# Patient Record
Sex: Male | Born: 1945 | Race: Black or African American | Hispanic: No | Marital: Married | Smoking: Never smoker
Health system: Southern US, Community
[De-identification: ages and names within clinical notes are randomized; demographics above are authoritative.]

## PROBLEM LIST (undated history)

## (undated) DIAGNOSIS — E119 Type 2 diabetes mellitus without complications: Secondary | ICD-10-CM

## (undated) DIAGNOSIS — T7840XA Allergy, unspecified, initial encounter: Secondary | ICD-10-CM

## (undated) DIAGNOSIS — F4310 Post-traumatic stress disorder, unspecified: Secondary | ICD-10-CM

## (undated) DIAGNOSIS — E785 Hyperlipidemia, unspecified: Secondary | ICD-10-CM

## (undated) DIAGNOSIS — M109 Gout, unspecified: Secondary | ICD-10-CM

## (undated) DIAGNOSIS — G2581 Restless legs syndrome: Secondary | ICD-10-CM

## (undated) DIAGNOSIS — N529 Male erectile dysfunction, unspecified: Secondary | ICD-10-CM

## (undated) DIAGNOSIS — I10 Essential (primary) hypertension: Secondary | ICD-10-CM

## (undated) DIAGNOSIS — H409 Unspecified glaucoma: Secondary | ICD-10-CM

## (undated) HISTORY — DX: Type 2 diabetes mellitus without complications: E11.9

## (undated) HISTORY — DX: Gout, unspecified: M10.9

## (undated) HISTORY — DX: Male erectile dysfunction, unspecified: N52.9

## (undated) HISTORY — DX: Post-traumatic stress disorder, unspecified: F43.10

## (undated) HISTORY — DX: Hyperlipidemia, unspecified: E78.5

## (undated) HISTORY — DX: Restless legs syndrome: G25.81

## (undated) HISTORY — DX: Unspecified glaucoma: H40.9

## (undated) HISTORY — DX: Allergy, unspecified, initial encounter: T78.40XA

## (undated) HISTORY — DX: Essential (primary) hypertension: I10

---

## 1998-03-06 HISTORY — PX: OTHER SURGICAL HISTORY: SHX169

## 2005-10-06 ENCOUNTER — Emergency Department (HOSPITAL_COMMUNITY): Admission: EM | Admit: 2005-10-06 | Discharge: 2005-10-06 | Payer: Self-pay | Admitting: Emergency Medicine
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
## 2011-06-05 HISTORY — PX: UMBILICAL HERNIA REPAIR: SHX196

## 2012-05-22 ENCOUNTER — Telehealth: Payer: Self-pay | Admitting: *Deleted

## 2012-05-24 NOTE — Telephone Encounter (Signed)
I called [facility] and spoke with the pharmacist who is requesting Requip 1 mg 1 po qhs #90.-- Pt ID # is [id number] The patient was last seen 03/12/12 for medication refills. Please advise if refill is appropriate.    Thank You     KP//CMA

## 2012-05-27 ENCOUNTER — Other Ambulatory Visit: Payer: Self-pay | Admitting: Family Medicine

## 2012-05-27 MED ORDER — ROPINIROLE HCL 1 MG PO TABS: 1.0000 mg | ORAL_TABLET | Freq: Every day | ORAL | Status: DC

## 2012-07-09 ENCOUNTER — Ambulatory Visit (INDEPENDENT_AMBULATORY_CARE_PROVIDER_SITE_OTHER): Payer: Medicare Other | Admitting: Family Medicine

## 2012-07-09 VITALS — BP 134/66 | HR 75 | Resp 14 | Ht 68.5 in | Wt 240.0 lb

## 2012-07-09 DIAGNOSIS — R51 Headache: Secondary | ICD-10-CM

## 2012-07-09 DIAGNOSIS — G2581 Restless legs syndrome: Secondary | ICD-10-CM

## 2012-07-09 DIAGNOSIS — E669 Obesity, unspecified: Secondary | ICD-10-CM

## 2012-07-09 MED ORDER — PHENTERMINE HCL 37.5 MG PO TABS: ORAL_TABLET | ORAL | Status: DC

## 2012-07-09 MED ORDER — PHENTERMINE-TOPIRAMATE ER 7.5-46 MG PO CP24: 1.0000 | ORAL_CAPSULE | Freq: Every day | ORAL | Status: DC

## 2012-07-09 MED ORDER — TOPIRAMATE ER 50 MG PO CAP24: 1.0000 | ORAL_CAPSULE | Freq: Every day | ORAL | Status: DC

## 2012-07-09 MED ORDER — PHENTERMINE-TOPIRAMATE ER 11.25-69 MG PO CP24: 1.0000 | ORAL_CAPSULE | ORAL | Status: DC

## 2012-07-09 NOTE — Patient Instructions (Addendum)
1)  Weight Loss - You have done great with your weight loss 18 lbs so keep up the good work.  Since the cost of Qsymia is so high we may need to be creative.  See if this co-pay card helps you at all.  If it does and you want to stay on what you have now then you can.  IF you want to try and save money one option would be to take a higher dosage every other day.  Another option is to make up our own Qsymia by combining a long acting Topamax and add some phentermine.    2)  Medication Refills - Check with [facility] to see what if any refills you need.   1800 Calorie Diet for Diabetes Meal Planning The 1800 calorie diet is designed for eating up to 1800 calories each day. Following this diet and making healthy meal choices can help improve overall health. This diet controls blood sugar (glucose) levels and can also help lower blood pressure and cholesterol. SERVING SIZES Measuring foods and serving sizes helps to make sure you are getting the right amount of food. The list below tells how big or small some common serving sizes are:  1 oz.........4 stacked dice.  3 oz........Marland KitchenDeck of cards.  1 tsp.......Marland KitchenTip of little finger.  1 tbs......Marland KitchenMarland KitchenThumb.  2 tbs.......Marland KitchenGolf ball.   cup......Marland KitchenHalf of a fist.  1 cup.......Marland KitchenA fist. GUIDELINES FOR CHOOSING FOODS The goal of this diet is to eat a variety of foods and limit calories to 1800 each day. This can be done by choosing foods that are low in calories and fat. The diet also suggests eating small amounts of food frequently. Doing this helps control your blood glucose levels so they do not get too high or too low. Each meal or snack may include a protein food source to help you feel more satisfied and to stabilize your blood glucose. Try to eat about the same amount of food around the same time each day. This includes weekend days, travel days, and days off work. Space your meals about 4 to 5 hours apart and add a snack between them if you wish.   For example, a daily food plan could include breakfast, a morning snack, lunch, dinner, and an evening snack. Healthy meals and snacks include whole grains, vegetables, fruits, lean meats, poultry, fish, and dairy products. As you plan your meals, select a variety of foods. Choose from the bread and starch, vegetable, fruit, dairy, and meat/protein groups. Examples of foods from each group and their suggested serving sizes are listed below. Use measuring cups and spoons to become familiar with what a healthy portion looks like. Bread and Starch Each serving equals 15 grams of carbohydrates.  1 slice bread.   bagel.   cup cold cereal (unsweetened).   cup hot cereal or mashed potatoes.  1 small potato (size of a computer mouse).   cup cooked pasta or rice.   English muffin.  1 cup broth-based soup.  3 cups of popcorn.  4 to 6 whole-wheat crackers.   cup cooked beans, peas, or corn. Vegetable Each serving equals 5 grams of carbohydrates.   cup cooked vegetables.  1 cup raw vegetables.   cup tomato or vegetable juice. Fruit Each serving equals 15 grams of carbohydrates.  1 small apple or orange.  1 cup watermelon or strawberries.   cup applesauce (no sugar added).  2 tbs raisins.   banana.   cup canned fruit, packed in water,  its own juice, or sweetened with a sugar substitute.   cup unsweetened fruit juice. Dairy Each serving equals 12 to 15 grams of carbohydrates.  1 cup fat-free milk.  6 oz artificially sweetened yogurt or plain yogurt.  1 cup low-fat buttermilk.  1 cup soy milk.  1 cup almond milk. Meat/Protein  1 large egg.  2 to 3 oz meat, poultry, or fish.   cup low-fat cottage cheese.  1 tbs peanut butter.  1 oz low-fat cheese.   cup tuna in water.   cup tofu. Fat  1 tsp oil.  1 tsp trans-fat-free margarine.  1 tsp butter.  1 tsp mayonnaise.  2 tbs avocado.  1 tbs salad dressing.  1 tbs cream cheese.  2  tbs sour cream. SAMPLE 1800 CALORIE DIET PLAN Breakfast   cup unsweetened cereal (1 carb serving).  1 cup fat-free milk (1 carb serving).  1 slice whole-wheat toast (1 carb serving).   small banana (1 carb serving).  1 scrambled egg.  1 tsp trans-fat-free margarine. Lunch  Tuna sandwich.  2 slices whole-wheat bread (2 carb servings).   cup canned tuna in water, drained.  1 tbs reduced fat mayonnaise.  1 stalk celery, chopped.  2 slices tomato.  1 lettuce leaf.  1 cup carrot sticks.  24 to 30 seedless grapes (2 carb servings).  6 oz light yogurt (1 carb serving). Afternoon Snack  3 graham cracker squares (1 carb serving).  Fat-free milk, 1 cup (1 carb serving).  1 tbs peanut butter. Dinner  3 oz salmon, broiled with 1 tsp oil.  1 cup mashed potatoes (2 carb servings) with 1 tsp trans-fat-free margarine.  1 cup fresh or frozen green beans.  1 cup steamed asparagus.  1 cup fat-free milk (1 carb serving). Evening Snack  3 cups air-popped popcorn (1 carb serving).  2 tbs parmesan cheese sprinkled on top. MEAL PLAN Use this worksheet to help you make a daily meal plan based on the 1800 calorie diet suggestions. If you are using this plan to help you control your blood glucose, you may interchange carbohydrate-containing foods (dairy, starches, and fruits). Select a variety of fresh foods of varying colors and flavors. The total amount of carbohydrate in your meals or snacks is more important than making sure you include all of the food groups every time you eat. Choose from the following foods to build your day's meals:  8 Starches.  4 Vegetables.  3 Fruits.  2 Dairy.  6 to 7 oz Meat/Protein.  Up to 4 Fats. Your dietician can use this worksheet to help you decide how many servings and which types of foods are right for you. BREAKFAST Food Group and Servings / Food Choice Starch ________________________________________________________ Dairy  _________________________________________________________ Fruit _________________________________________________________ Meat/Protein __________________________________________________ Fat ___________________________________________________________ LUNCH Food Group and Servings / Food Choice Starch ________________________________________________________ Meat/Protein __________________________________________________ Vegetable _____________________________________________________ Fruit _________________________________________________________ Dairy _________________________________________________________ Fat ___________________________________________________________ Aura Fey Food Group and Servings / Food Choice Starch ________________________________________________________ Meat/Protein __________________________________________________ Fruit __________________________________________________________ Dairy _________________________________________________________ Laural Golden Food Group and Servings / Food Choice Starch _________________________________________________________ Meat/Protein ___________________________________________________ Dairy __________________________________________________________ Vegetable ______________________________________________________ Fruit ___________________________________________________________ Fat ____________________________________________________________ Lollie Sails Food Group and Servings / Food Choice Fruit __________________________________________________________ Meat/Protein ___________________________________________________ Dairy __________________________________________________________ Starch _________________________________________________________ DAILY TOTALS Starch ____________________________ Vegetable _________________________ Fruit _____________________________ Dairy  _____________________________ Meat/Protein______________________ Fat _______________________________ Document Released: 09/12/2004 Document Revised: 05/15/2011 Document Reviewed: 01/06/2011 Exit[employer] Patient Information 2013 [place], [place].
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
## 2012-07-18 ENCOUNTER — Other Ambulatory Visit: Payer: Self-pay | Admitting: Family Medicine

## 2012-07-20 ENCOUNTER — Encounter: Payer: Self-pay | Admitting: Family Medicine

## 2012-07-21 ENCOUNTER — Encounter: Payer: Self-pay | Admitting: Family Medicine

## 2012-07-21 DIAGNOSIS — E669 Obesity, unspecified: Secondary | ICD-10-CM | POA: Insufficient documentation

## 2012-07-21 DIAGNOSIS — G2581 Restless legs syndrome: Secondary | ICD-10-CM | POA: Insufficient documentation

## 2012-07-21 DIAGNOSIS — R51 Headache: Secondary | ICD-10-CM | POA: Insufficient documentation

## 2012-07-21 DIAGNOSIS — R519 Headache, unspecified: Secondary | ICD-10-CM | POA: Insufficient documentation

## 2012-07-21 NOTE — Assessment & Plan Note (Signed)
Refilled his Requip.

## 2012-07-21 NOTE — Assessment & Plan Note (Signed)
He has done great with his weight loss.  He is going to continue on Qsymia vs Phentermine.

## 2012-07-21 NOTE — Assessment & Plan Note (Signed)
He thinks that he has had less headaches on the Qsymia so he may try the Topiramate ER.

## 2012-07-31 ENCOUNTER — Other Ambulatory Visit: Payer: Self-pay | Admitting: Family Medicine

## 2012-08-22 ENCOUNTER — Other Ambulatory Visit: Payer: Self-pay | Admitting: Family Medicine

## 2012-09-02 ENCOUNTER — Other Ambulatory Visit: Payer: Self-pay

## 2012-09-02 NOTE — Telephone Encounter (Addendum)
I called Mr. Fillingim to see which pharmacy to call her 30 day supply of Exforge in to since [facility] only will fill 90 day supply or more.  I attempted to call the patient again on 09/03/2012 @ 10:15. I couldn't leave a message due to the mailbox being full. LB

## 2012-09-03 ENCOUNTER — Other Ambulatory Visit: Payer: Self-pay | Admitting: Family Medicine

## 2012-09-03 NOTE — Telephone Encounter (Signed)
We have tried to contact this pt for an appointment for his labs and then a week later to see Dr Herma Carson (no answer and voice mail is full).  I'm denying his refills until he contact us. PG

## 2012-09-05 ENCOUNTER — Other Ambulatory Visit: Payer: Medicare Other | Admitting: Family Medicine

## 2012-09-05 ENCOUNTER — Telehealth: Payer: Self-pay | Admitting: *Deleted

## 2012-09-05 LAB — COMPREHENSIVE METABOLIC PANEL
ALT: 17 U/L (ref 0–53)
AST: 20 U/L (ref 0–37)
Albumin: 4 g/dL (ref 3.5–5.2)
Alkaline Phosphatase: 42 U/L (ref 39–117)
BUN: 10 mg/dL (ref 6–23)
CO2: 25 mEq/L (ref 19–32)
Calcium: 9 mg/dL (ref 8.4–10.5)
Chloride: 107 mEq/L (ref 96–112)
Creat: 0.9 mg/dL (ref 0.50–1.35)
Glucose, Bld: 95 mg/dL (ref 70–99)
Potassium: 3.8 mEq/L (ref 3.5–5.3)
Sodium: 137 mEq/L (ref 135–145)
Total Bilirubin: 0.6 mg/dL (ref 0.3–1.2)
Total Protein: 6.5 g/dL (ref 6.0–8.3)

## 2012-09-05 LAB — CBC WITH DIFFERENTIAL/PLATELET
Basophils Absolute: 0 10*3/uL (ref 0.0–0.1)
Basophils Relative: 0 % (ref 0–1)
Eosinophils Absolute: 0.1 10*3/uL (ref 0.0–0.7)
Eosinophils Relative: 2 % (ref 0–5)
HCT: 40.5 % (ref 39.0–52.0)
Hemoglobin: 14 g/dL (ref 13.0–17.0)
Lymphocytes Relative: 31 % (ref 12–46)
Lymphs Abs: 1.8 10*3/uL (ref 0.7–4.0)
MCH: 29.7 pg (ref 26.0–34.0)
MCHC: 34.6 g/dL (ref 30.0–36.0)
MCV: 86 fL (ref 78.0–100.0)
Monocytes Absolute: 0.6 10*3/uL (ref 0.1–1.0)
Monocytes Relative: 11 % (ref 3–12)
Neutro Abs: 3.2 10*3/uL (ref 1.7–7.7)
Neutrophils Relative %: 56 % (ref 43–77)
Platelets: 305 10*3/uL (ref 150–400)
RBC: 4.71 MIL/uL (ref 4.22–5.81)
RDW: 14.7 % (ref 11.5–15.5)
WBC: 5.8 10*3/uL (ref 4.0–10.5)

## 2012-09-05 LAB — LIPID PANEL
Cholesterol: 204 mg/dL — ABNORMAL HIGH (ref 0–200)
HDL: 41 mg/dL (ref >39)
LDL Cholesterol: 143 mg/dL — ABNORMAL HIGH (ref 0–99)
Total CHOL/HDL Ratio: 5 Ratio
Triglycerides: 100 mg/dL (ref <150)
VLDL: 20 mg/dL (ref 0–40)

## 2012-09-05 LAB — TSH: TSH: 1.709 u[IU]/mL (ref 0.350–4.500)

## 2012-09-05 LAB — HEMOGLOBIN A1C
Hgb A1c MFr Bld: 5.7 % — ABNORMAL HIGH (ref <5.7)
Mean Plasma Glucose: 117 mg/dL — ABNORMAL HIGH (ref <117)

## 2012-09-05 MED ORDER — AMLODIPINE-VALSARTAN-HCTZ 10-320-25 MG PO TABS: 1.0000 mg | ORAL_TABLET | Freq: Every day | ORAL | Status: DC

## 2012-09-05 NOTE — Telephone Encounter (Signed)
Please send in a 30 day supply on Exforge to [facility] on [address] in [place].  He went for labs today and will f/u on 09/10/12.

## 2012-09-10 ENCOUNTER — Ambulatory Visit (INDEPENDENT_AMBULATORY_CARE_PROVIDER_SITE_OTHER): Payer: Medicare Other | Admitting: Family Medicine

## 2012-09-10 ENCOUNTER — Encounter: Payer: Self-pay | Admitting: Family Medicine

## 2012-09-10 VITALS — BP 181/76 | HR 67 | Wt 243.0 lb

## 2012-09-10 DIAGNOSIS — I10 Essential (primary) hypertension: Secondary | ICD-10-CM

## 2012-09-10 DIAGNOSIS — IMO0001 Reserved for inherently not codable concepts without codable children: Secondary | ICD-10-CM

## 2012-09-10 DIAGNOSIS — E785 Hyperlipidemia, unspecified: Secondary | ICD-10-CM

## 2012-09-10 DIAGNOSIS — E119 Type 2 diabetes mellitus without complications: Secondary | ICD-10-CM

## 2012-09-10 MED ORDER — CANAGLIFLOZIN 300 MG PO TABS: 1.0000 | ORAL_TABLET | Freq: Every day | ORAL | Status: DC

## 2012-09-10 MED ORDER — AMLODIPINE-VALSARTAN-HCTZ 10-320-25 MG PO TABS: 1.0000 | ORAL_TABLET | Freq: Every day | ORAL | Status: DC

## 2012-09-10 MED ORDER — CARVEDILOL 25 MG PO TABS: 25.0000 mg | ORAL_TABLET | Freq: Two times a day (BID) | ORAL | Status: DC

## 2012-09-10 MED ORDER — EZETIMIBE 10 MG PO TABS: 10.0000 mg | ORAL_TABLET | Freq: Every day | ORAL | Status: DC

## 2012-09-10 NOTE — Progress Notes (Signed)
  Subjective:    Patient ID: Johnathan Long, male    DOB: 1945/11/04, 67 y.o.   MRN: [id number]  HPI  Johnathan Long is here today to follow up on his lab results, discuss the following conditions and get refills for several of his medications.    1)  Hypertension: His BP is high today because he is out of Exforge.    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 He does not monitor his sugars at home.  3)  Hyperlipidemia: He is doing well on his Zetia.   4)  GERD: He continues to do well on the Nexium.   5)  Restless Leg: His symptoms are controlled on Requip.     Review of Systems  Constitutional: Negative.   HENT: Negative.   Eyes: Negative.   Respiratory: Negative.   Cardiovascular: Negative.   Gastrointestinal: Negative.   Endocrine: Negative.   Genitourinary: Negative.   Musculoskeletal: Negative.   Skin: Negative.   Allergic/Immunologic: Negative.   Neurological: Negative.   Hematological: Negative.   Psychiatric/Behavioral: Negative.     Past Medical History  Diagnosis Date  . Hyperlipidemia   . Diabetes mellitus without complication   . Hypertension   . Restless leg syndrome   . Gout   . PTSD (post-traumatic stress disorder)   . Glaucoma   . Erectile dysfunction   . Allergy     Family History  Problem Relation Age of Onset  . Heart disease Father   . Cancer Sister     Breast Cancer   . 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     Education:  Engineer, agricultural    Tobacco Use/Exposure:  None    Alcohol Use:  None   Drug Use:  None   Diet:  Regular   Exercise:  Walking    Hobbies: Fishing, Music, Reading                Objective:   Physical Exam  Constitutional: He appears well-nourished.  HENT:  Head: Normocephalic.   Nose: Nose normal.  Mouth/Throat: Oropharynx is clear and moist.  Eyes: Conjunctivae are normal. No scleral icterus.  Neck: Neck supple. No thyromegaly present.  Cardiovascular: Normal rate, regular rhythm and normal heart sounds.   Pulmonary/Chest: Effort normal and breath sounds normal.  Abdominal: Soft. He exhibits no mass. There is no tenderness.  Musculoskeletal: Normal range of motion.  Lymphadenopathy:    He has no cervical adenopathy.  Neurological: He is alert.  Skin: Skin is warm and dry. No rash noted.  Psychiatric: He has a normal mood and affect. His behavior is normal. Judgment and thought content normal.          Assessment & Plan:

## 2012-09-10 NOTE — Patient Instructions (Addendum)
1)  Blood Pressure - I sent in a refill for the Exforge HCT and the carvedilol.  You should have plenty of the Tribenzor which is like Exforge that you can take till you get yours in the mail.  Check your BP on the Tribenzor and let me know if you think that it works better than the Exforge HCT.  If so, then the next time I see you I can switch you to Tribenzor.    2)  Blood Sugar- Almost PERFECT.  Let's try modifying your drugs a little.  Decrease your Kombiglyze from 2 per day to 1 and take it in the evening.  You will replace your morning dosage with the new drug Invokana.  You'll start with the 100 mg and then increase to the 300 mg.  This new drug (Invokana) may help you with weight loss.  I ordered the 300 mg from [facility].    3)  Cholesterol - Your LDL (bad cholesterol) is too high.  Try to be better with taking your Welchol every morning.  We'll recheck your level in 3 months along with the muscle enzyme CPK. We may add a statin called Livalo depending on your values.    Fat and Cholesterol Control Diet Cholesterol levels in your body are determined significantly by your diet. Cholesterol levels may also be related to heart disease. The following material helps to explain this relationship and discusses what you can do to help keep your heart healthy. Not all cholesterol is bad. Low-density lipoprotein (LDL) cholesterol is the "bad" cholesterol. It may cause fatty deposits to build up inside your arteries. High-density lipoprotein (HDL) cholesterol is "good." It helps to remove the "bad" LDL cholesterol from your blood. Cholesterol is a very important risk factor for heart disease. Other risk factors are high blood pressure, smoking, stress, heredity, and weight. The heart muscle gets its supply of blood through the coronary arteries. If your LDL cholesterol is high and your HDL cholesterol is low, you are at risk for having fatty deposits build up in your coronary arteries. This leaves less  room through which blood can flow. Without sufficient blood and oxygen, the heart muscle cannot function properly and you may feel chest pains (angina pectoris). When a coronary artery closes up entirely, a part of the heart muscle may die causing a heart attack (myocardial infarction). CHECKING CHOLESTEROL When your caregiver sends your blood to a lab to be examined for cholesterol, a complete lipid (fat) profile may be done. With this test, the total amount of cholesterol and levels of LDL and HDL are determined. Triglycerides are a type of fat that circulates in the blood. They can also be used to determine heart disease risk. The list below describes what the numbers should be: Test: Total Cholesterol.  Less than 200 mg/dl. Test: LDL "bad cholesterol."  Less than 100 mg/dl.  Less than 70 mg/dl if you are at very high risk of a heart attack or sudden cardiac death. Test: HDL "good cholesterol."  Greater than 50 mg/dl for women.  Greater than 40 mg/dl for men. Test: Triglycerides.  Less than 150 mg/dl. CONTROLLING CHOLESTEROL WITH DIET Although exercise and lifestyle factors are important, your diet is key. That is because certain foods are known to raise cholesterol and others to lower it. The goal is to balance foods for their effect on cholesterol and more importantly, to replace saturated and trans fat with other types of fat, such as monounsaturated fat, polyunsaturated fat, and  omega-3 fatty acids. On average, a person should consume no more than 15 to 17 g of saturated fat daily. Saturated and trans fats are considered "bad" fats, and they will raise LDL cholesterol. Saturated fats are primarily found in animal products such as meats, butter, and cream. However, that does not mean you need to give up all your favorite foods. Today, there are good tasting, low-fat, low-cholesterol substitutes for most of the things you like to eat. Choose low-fat or nonfat alternatives. Choose round or  loin cuts of red meat. These types of cuts are lowest in fat and cholesterol. Chicken (without the skin), fish, veal, and ground [place] breast are great choices. Eliminate fatty meats, such as hot dogs and salami. Even shellfish have little or no saturated fat. Have a 3 oz (85 g) portion when you eat lean meat, poultry, or fish. Trans fats are also called "partially hydrogenated oils." They are oils that have been scientifically manipulated so that they are solid at room temperature resulting in a longer shelf life and improved taste and texture of foods in which they are added. Trans fats are found in stick margarine, some tub margarines, cookies, crackers, and baked goods.  When baking and cooking, oils are a great substitute for butter. The monounsaturated oils are especially beneficial since it is believed they lower LDL and raise HDL. The oils you should avoid entirely are saturated tropical oils, such as coconut and palm.  Remember to eat a lot from food groups that are naturally free of saturated and trans fat, including fish, fruit, vegetables, beans, grains (barley, rice, couscous, bulgur wheat), and pasta (without cream sauces).  IDENTIFYING FOODS THAT LOWER CHOLESTEROL  Soluble fiber may lower your cholesterol. This type of fiber is found in fruits such as apples, vegetables such as broccoli, potatoes, and carrots, legumes such as beans, peas, and lentils, and grains such as barley. Foods fortified with plant sterols (phytosterol) may also lower cholesterol. You should eat at least 2 g per day of these foods for a cholesterol lowering effect.  Read package labels to identify low-saturated fats, trans fat free, and low-fat foods at the supermarket. Select cheeses that have only 2 to 3 g saturated fat per ounce. Use a heart-healthy tub margarine that is free of trans fats or partially hydrogenated oil. When buying baked goods (cookies, crackers), avoid partially hydrogenated oils. Breads and muffins  should be made from whole grains (whole-wheat or whole oat flour, instead of "flour" or "enriched flour"). Buy non-creamy canned soups with reduced salt and no added fats.  FOOD PREPARATION TECHNIQUES  Never deep-fry. If you must fry, either stir-fry, which uses very little fat, or use non-stick cooking sprays. When possible, broil, bake, or roast meats, and steam vegetables. Instead of putting butter or margarine on vegetables, use lemon and herbs, applesauce, and cinnamon (for squash and sweet potatoes), nonfat yogurt, salsa, and low-fat dressings for salads.  LOW-SATURATED FAT / LOW-FAT FOOD SUBSTITUTES Meats / Saturated Fat (g)  Avoid: Steak, marbled (3 oz/85 g) / 11 g  Choose: Steak, lean (3 oz/85 g) / 4 g  Avoid: Hamburger (3 oz/85 g) / 7 g  Choose: Hamburger, lean (3 oz/85 g) / 5 g  Avoid: Ham (3 oz/85 g) / 6 g  Choose: Ham, lean cut (3 oz/85 g) / 2.4 g  Avoid: Chicken, with skin, dark meat (3 oz/85 g) / 4 g  Choose: Chicken, skin removed, dark meat (3 oz/85 g) / 2 g  Avoid: Chicken,  with skin, light meat (3 oz/85 g) / 2.5 g  Choose: Chicken, skin removed, light meat (3 oz/85 g) / 1 g Dairy / Saturated Fat (g)  Avoid: Whole milk (1 cup) / 5 g  Choose: Low-fat milk, 2% (1 cup) / 3 g  Choose: Low-fat milk, 1% (1 cup) / 1.5 g  Choose: Skim milk (1 cup) / 0.3 g  Avoid: Hard cheese (1 oz/28 g) / 6 g  Choose: Skim milk cheese (1 oz/28 g) / 2 to 3 g  Avoid: Cottage cheese, 4% fat (1 cup) / 6.5 g  Choose: Low-fat cottage cheese, 1% fat (1 cup) / 1.5 g  Avoid: Ice cream (1 cup) / 9 g  Choose: Sherbet (1 cup) / 2.5 g  Choose: Nonfat frozen yogurt (1 cup) / 0.3 g  Choose: Frozen fruit bar / trace  Avoid: Whipped cream (1 tbs) / 3.5 g  Choose: Nondairy whipped topping (1 tbs) / 1 g Condiments / Saturated Fat (g)  Avoid: Mayonnaise (1 tbs) / 2 g  Choose: Low-fat mayonnaise (1 tbs) / 1 g  Avoid: Butter (1 tbs) / 7 g  Choose: Extra light margarine (1 tbs) / 1  g  Avoid: Coconut oil (1 tbs) / 11.8 g  Choose: Olive oil (1 tbs) / 1.8 g  Choose: Corn oil (1 tbs) / 1.7 g  Choose: Safflower oil (1 tbs) / 1.2 g  Choose: Sunflower oil (1 tbs) / 1.4 g  Choose: Soybean oil (1 tbs) / 2.4 g  Choose: Canola oil (1 tbs) / 1 g Document Released: 02/20/2005 Document Revised: 05/15/2011 Document Reviewed: 08/11/2010 Exit[employer] Patient Information 2014 [place], [place].

## 2012-10-13 DIAGNOSIS — E119 Type 2 diabetes mellitus without complications: Secondary | ICD-10-CM | POA: Insufficient documentation

## 2012-10-13 DIAGNOSIS — I10 Essential (primary) hypertension: Secondary | ICD-10-CM | POA: Insufficient documentation

## 2012-10-13 DIAGNOSIS — E785 Hyperlipidemia, unspecified: Secondary | ICD-10-CM | POA: Insufficient documentation

## 2012-10-13 DIAGNOSIS — IMO0001 Reserved for inherently not codable concepts without codable children: Secondary | ICD-10-CM | POA: Insufficient documentation

## 2012-10-13 NOTE — Assessment & Plan Note (Addendum)
Refilled his Exforge HCT and Carvedilol.  He was given samples of Benicar HCT to take until his Exforge HCT comes in.

## 2012-10-13 NOTE — Assessment & Plan Note (Signed)
He is going to decrease his Kombiglyze and will add Invokana.

## 2012-10-13 NOTE — Assessment & Plan Note (Signed)
We are going to check a CPK level.

## 2012-10-13 NOTE — Assessment & Plan Note (Signed)
He is going to continue to work hard on his diet and exercise and will be better with taking his Welchol daily.

## 2012-12-11 ENCOUNTER — Other Ambulatory Visit: Payer: Medicare Other

## 2012-12-11 ENCOUNTER — Other Ambulatory Visit: Payer: Self-pay | Admitting: *Deleted

## 2012-12-11 DIAGNOSIS — E119 Type 2 diabetes mellitus without complications: Secondary | ICD-10-CM

## 2012-12-11 DIAGNOSIS — E785 Hyperlipidemia, unspecified: Secondary | ICD-10-CM

## 2012-12-11 DIAGNOSIS — IMO0001 Reserved for inherently not codable concepts without codable children: Secondary | ICD-10-CM

## 2012-12-12 ENCOUNTER — Other Ambulatory Visit: Payer: Medicare Other

## 2012-12-12 LAB — LIPID PANEL
Cholesterol: 237 mg/dL — ABNORMAL HIGH (ref 0–200)
HDL: 44 mg/dL (ref >39)
LDL Cholesterol: 168 mg/dL — ABNORMAL HIGH (ref 0–99)
Total CHOL/HDL Ratio: 5.4 Ratio
Triglycerides: 123 mg/dL (ref <150)
VLDL: 25 mg/dL (ref 0–40)

## 2012-12-12 LAB — HEMOGLOBIN A1C
Hgb A1c MFr Bld: 6.1 % — ABNORMAL HIGH (ref <5.7)
Mean Plasma Glucose: 128 mg/dL — ABNORMAL HIGH (ref <117)

## 2012-12-12 LAB — CK: Total CK: 462 U/L — ABNORMAL HIGH (ref 7–232)

## 2012-12-18 ENCOUNTER — Ambulatory Visit (INDEPENDENT_AMBULATORY_CARE_PROVIDER_SITE_OTHER): Payer: Medicare Other | Admitting: Family Medicine

## 2012-12-18 ENCOUNTER — Encounter: Payer: Self-pay | Admitting: Family Medicine

## 2012-12-18 VITALS — BP 149/75 | HR 62 | Resp 16 | Ht 63.5 in | Wt 244.0 lb

## 2012-12-18 DIAGNOSIS — M1990 Unspecified osteoarthritis, unspecified site: Secondary | ICD-10-CM

## 2012-12-18 DIAGNOSIS — N400 Enlarged prostate without lower urinary tract symptoms: Secondary | ICD-10-CM

## 2012-12-18 DIAGNOSIS — K219 Gastro-esophageal reflux disease without esophagitis: Secondary | ICD-10-CM

## 2012-12-18 DIAGNOSIS — E785 Hyperlipidemia, unspecified: Secondary | ICD-10-CM

## 2012-12-18 DIAGNOSIS — M109 Gout, unspecified: Secondary | ICD-10-CM

## 2012-12-18 DIAGNOSIS — G2581 Restless legs syndrome: Secondary | ICD-10-CM

## 2012-12-18 DIAGNOSIS — I10 Essential (primary) hypertension: Secondary | ICD-10-CM

## 2012-12-18 DIAGNOSIS — E119 Type 2 diabetes mellitus without complications: Secondary | ICD-10-CM

## 2012-12-18 MED ORDER — NABUMETONE 750 MG PO TABS: 750.0000 mg | ORAL_TABLET | Freq: Two times a day (BID) | ORAL | Status: DC

## 2012-12-18 MED ORDER — ALLOPURINOL 100 MG PO TABS: 100.0000 mg | ORAL_TABLET | Freq: Every day | ORAL | Status: DC

## 2012-12-18 MED ORDER — COLESEVELAM HCL 625 MG PO TABS: 1875.0000 mg | ORAL_TABLET | Freq: Two times a day (BID) | ORAL | Status: DC

## 2012-12-18 MED ORDER — TADALAFIL 5 MG PO TABS: 5.0000 mg | ORAL_TABLET | Freq: Every day | ORAL | Status: DC | PRN

## 2012-12-18 MED ORDER — ESOMEPRAZOLE MAGNESIUM 40 MG PO CPDR: 40.0000 mg | DELAYED_RELEASE_CAPSULE | Freq: Every day | ORAL | Status: DC

## 2012-12-18 MED ORDER — EZETIMIBE 10 MG PO TABS: 10.0000 mg | ORAL_TABLET | Freq: Every day | ORAL | Status: DC

## 2012-12-18 MED ORDER — SAXAGLIPTIN-METFORMIN ER 2.5-1000 MG PO TB24: 2.0000 | ORAL_TABLET | Freq: Every day | ORAL | Status: DC

## 2012-12-18 MED ORDER — ROPINIROLE HCL 1 MG PO TABS: 1.0000 mg | ORAL_TABLET | Freq: Every day | ORAL | Status: DC

## 2012-12-18 MED ORDER — CANAGLIFLOZIN 300 MG PO TABS: 1.0000 | ORAL_TABLET | Freq: Every day | ORAL | Status: DC

## 2012-12-18 MED ORDER — AMLODIPINE-VALSARTAN-HCTZ 10-320-25 MG PO TABS: 1.0000 | ORAL_TABLET | Freq: Every day | ORAL | Status: DC

## 2012-12-18 NOTE — Progress Notes (Signed)
Subjective:    Patient ID: Johnathan Long, male    DOB: [birth year], 67 y.o.   MRN: [id number]  HPI  Johnathan Long is here today to go over his most recent lab results and to discuss the conditions listed below:    1)  Hypertension: He continues to take carvedilol (5 mg) and Exforge/HCT (10/320/25 mg.)  He needs both medications refilled.   2)  Type II DM:  He is doing well on the combination of Invokana (300 mg) and Kombyiglyze (2.07/998 mg)   3)  Arthritis:  He has been discussing his arthritic pain [facility] who recommended that he cut back his Relafen to one tab per day.  He is to add some Tylenol if he has extra pain.     4)  Hyperlipidemia:  He needs a refill on both his Zetia (10 mg) and Welchol (3.75 mg).    5)  RLS:  He continues to do well with his ropirinole (1 mg).     Review of Systems  Constitutional: Negative.   HENT: Negative.   Eyes: Negative.   Respiratory: Negative.   Cardiovascular: Negative.   Gastrointestinal: Negative.   Endocrine: Negative.   Genitourinary: Negative.   Musculoskeletal: Negative.   Skin: Negative.   Allergic/Immunologic: Negative.   Neurological: Negative.   Hematological: Negative.   Psychiatric/Behavioral: Negative.      Past Medical History  Diagnosis Date  . Hyperlipidemia   . Diabetes mellitus without complication   . Hypertension   . Restless leg syndrome   . Gout   . PTSD (post-traumatic stress disorder)   . Glaucoma   . Erectile dysfunction   . Allergy      Family History  Problem Relation Age of Onset  . Heart disease Father   . Cancer Sister     Breast Cancer   . 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     Education:  Engineer, agricultural    Tobacco Use/Exposure:  None    Alcohol Use:   None   Drug Use:  None   Diet:  Regular   Exercise:  Walking    Hobbies: Fishing, Music, Reading                Objective:   Physical Exam  Vitals reviewed. Constitutional: He is oriented to person, place, and time. He appears well-developed and well-nourished. No distress.  HENT:  Head: Normocephalic.  Eyes: No scleral icterus.  Neck: Neck supple. No thyromegaly present.  Cardiovascular: Normal rate, regular rhythm and normal heart sounds.  Exam reveals no gallop and no friction rub.   No murmur heard. Pulmonary/Chest: Effort normal and breath sounds normal. No respiratory distress. He exhibits no tenderness.  Musculoskeletal: He exhibits no edema.  Neurological: He is alert and oriented to person, place, and time.  Skin: Skin is warm and dry. No rash noted.  Psychiatric: He has a normal mood and affect. His behavior is normal. Judgment and thought content normal.      Assessment & Plan:   Abdulkareem was seen today for medication management.  He is doing well on his current medications.    Diagnoses and associated orders for this visit:  Essential hypertension,  benign - Amlodipine-Valsartan-HCTZ (EXFORGE HCT) 10-320-25 MG TABS; Take 1 tablet by mouth daily.  Type II or unspecified type diabetes mellitus without mention of complication, not stated as uncontrolled - Canagliflozin (INVOKANA) 300 MG TABS; Take 1 tablet (300 mg total) by mouth daily before breakfast. - Saxagliptin-Metformin (KOMBIGLYZE XR) 2.07-998 MG TB24; Take 2 tablets by mouth daily.  Other and unspecified hyperlipidemia - colesevelam (WELCHOL) 625 MG tablet; Take 3 tablets (1,875 mg total) by mouth 2 (two) times daily with a meal. - ezetimibe (ZETIA) 10 MG tablet; Take 1 tablet (10 mg total) by mouth daily.  Gout - allopurinol (ZYLOPRIM) 100 MG tablet; Take 1 tablet (100 mg total) by mouth daily.  BPH (benign prostatic hyperplasia) - tadalafil (CIALIS) 5 MG tablet; Take 1 tablet (5 mg total) by mouth  daily as needed.  Restless legs syndrome (RLS) - rOPINIRole (REQUIP) 1 MG tablet; Take 1 tablet (1 mg total) by mouth at bedtime.  Osteoarthritis - nabumetone (RELAFEN) 750 MG tablet; Take 1 tablet (750 mg total) by mouth 2 (two) times daily.  GERD (gastroesophageal reflux disease) - esomeprazole (NEXIUM) 40 MG capsule; Take 1 capsule (40 mg total) by mouth daily before breakfast.  TIME SPENT "FACE TO FACE" WITH PATIENT -  60  MINS

## 2012-12-24 ENCOUNTER — Other Ambulatory Visit: Payer: Self-pay | Admitting: *Deleted

## 2012-12-24 NOTE — Telephone Encounter (Signed)
Invokana, Exforge and Zetia were called in to [facility] as it was originally written (sent by mistake to [facility]. PG

## 2012-12-27 ENCOUNTER — Other Ambulatory Visit: Payer: Self-pay | Admitting: Family Medicine

## 2013-01-17 ENCOUNTER — Other Ambulatory Visit: Payer: Self-pay | Admitting: Family Medicine

## 2013-02-26 ENCOUNTER — Other Ambulatory Visit: Payer: Self-pay | Admitting: Family Medicine

## 2013-03-05 ENCOUNTER — Other Ambulatory Visit: Payer: Self-pay | Admitting: Family Medicine

## 2013-03-07 ENCOUNTER — Other Ambulatory Visit: Payer: Self-pay | Admitting: Family Medicine

## 2013-03-07 NOTE — Telephone Encounter (Signed)
This medication was originally refilled during patient's last office visit on 12/2012 to a local pharmacy.  Sent to [facility] with current refills available. PG

## 2013-04-09 ENCOUNTER — Ambulatory Visit (INDEPENDENT_AMBULATORY_CARE_PROVIDER_SITE_OTHER): Payer: Medicare Other | Admitting: Family Medicine

## 2013-04-09 ENCOUNTER — Encounter: Payer: Self-pay | Admitting: Family Medicine

## 2013-04-09 VITALS — BP 146/70 | HR 67 | Wt 240.0 lb

## 2013-04-09 DIAGNOSIS — M109 Gout, unspecified: Secondary | ICD-10-CM

## 2013-04-09 DIAGNOSIS — G2581 Restless legs syndrome: Secondary | ICD-10-CM

## 2013-04-09 DIAGNOSIS — M1990 Unspecified osteoarthritis, unspecified site: Secondary | ICD-10-CM

## 2013-04-09 DIAGNOSIS — E119 Type 2 diabetes mellitus without complications: Secondary | ICD-10-CM

## 2013-04-09 DIAGNOSIS — I10 Essential (primary) hypertension: Secondary | ICD-10-CM

## 2013-04-09 DIAGNOSIS — N400 Enlarged prostate without lower urinary tract symptoms: Secondary | ICD-10-CM

## 2013-04-09 DIAGNOSIS — M25519 Pain in unspecified shoulder: Secondary | ICD-10-CM

## 2013-04-09 LAB — POCT GLYCOSYLATED HEMOGLOBIN (HGB A1C): Hemoglobin A1C: 5.8

## 2013-04-09 MED ORDER — TADALAFIL 5 MG PO TABS: 5.0000 mg | ORAL_TABLET | Freq: Every day | ORAL | Status: AC | PRN

## 2013-04-09 MED ORDER — CARVEDILOL 25 MG PO TABS: 25.0000 mg | ORAL_TABLET | Freq: Two times a day (BID) | ORAL | Status: AC

## 2013-04-09 MED ORDER — DICLOFENAC SODIUM 1 % TD GEL: 4.0000 g | Freq: Four times a day (QID) | TRANSDERMAL | Status: DC

## 2013-04-09 MED ORDER — ROPINIROLE HCL 1 MG PO TABS: 1.0000 mg | ORAL_TABLET | Freq: Every day | ORAL | Status: AC

## 2013-04-09 MED ORDER — NABUMETONE 750 MG PO TABS: 750.0000 mg | ORAL_TABLET | Freq: Two times a day (BID) | ORAL | Status: DC

## 2013-04-09 MED ORDER — SAXAGLIPTIN-METFORMIN ER 2.5-1000 MG PO TB24: 2.0000 | ORAL_TABLET | Freq: Every day | ORAL | Status: DC

## 2013-04-09 MED ORDER — ALLOPURINOL 100 MG PO TABS: 100.0000 mg | ORAL_TABLET | Freq: Every day | ORAL | Status: AC

## 2013-05-13 ENCOUNTER — Encounter: Payer: Self-pay | Admitting: Family Medicine

## 2013-05-13 NOTE — Progress Notes (Signed)
Subjective:    Patient ID: Johnathan Long, male    DOB: Jan 03, 1946, 68 y.o.   MRN: [id number]  HPI  Johnathan Long is here today to discuss the following conditions:    1)  Gout - His symptoms are controlled with allopurinol.  2)  Osteoarthritis - He needs to have his Relafen refilled.    3)  BP - His pressure is a little high today.  He needs to have his carvedilol refilled.    4)  BPH- His symptoms are improved on Cialis.    5)  Type II DM - He is trying to watch his diet.  He needs to have his A1c rechecked.    6)  RLS - His symptoms are well controlled on Requip.   7)  Shoulder Pain - He needs a refill for Voltaren Gel.     Review of Systems  Constitutional: Negative for fatigue and unexpected weight change.  HENT: Negative.   Respiratory: Negative for shortness of breath.   Cardiovascular: Negative for chest pain, palpitations and leg swelling.  Gastrointestinal: Negative.   Genitourinary: Negative.   Musculoskeletal: Negative for myalgias.  Skin: Negative.   Neurological: Negative.   Psychiatric/Behavioral: Negative.     Past Medical History  Diagnosis Date  . Hyperlipidemia   . Diabetes mellitus without complication   . Hypertension   . Restless leg syndrome   . Gout   . PTSD (post-traumatic stress disorder)   . Glaucoma   . Erectile dysfunction   . Allergy      Past Surgical History  Procedure Laterality Date  . Cardiac catherization  2000  . Umbilical hernia repair  06/2011     History   Social History Narrative   Marital Status: Married Nature conservation officer(Johnathan Long)   Children:  Son (01) - Step Children (04)    Pets: Cat (01)    Living Situation: Lives with spouse   Occupation:  Retired    Programme researcher, broadcasting/film/videoducation:  Engineer, agriculturalHigh School Graduate    Tobacco Use/Exposure:  None    Alcohol Use:  None   Drug Use:  None   Diet:  Regular   Exercise:  Walking    Hobbies: Therapist, musicishing, Music, Reading                 Family History  Problem Relation Age of Onset  . Heart disease Father     . Cancer Sister     Breast Cancer   . Cancer Cousin     Several have had prostate cancer      Current Outpatient Prescriptions on File Prior to Visit  Medication Sig Dispense Refill  . Amlodipine-Valsartan-HCTZ (EXFORGE HCT) 10-320-25 MG TABS Take 1 tablet by mouth daily.  90 tablet  3  . aspirin 81 MG tablet Take 81 mg by mouth daily.      . Canagliflozin (INVOKANA) 300 MG TABS Take 1 tablet (300 mg total) by mouth daily before breakfast.  90 tablet  1  . Cholecalciferol (VITAMIN D PO) Take 5,000 Units by mouth daily.      . Colesevelam HCl 3.75 G PACK Take 1 packet by mouth.      . ezetimibe (ZETIA) 10 MG tablet Take 1 tablet (10 mg total) by mouth daily.  90 tablet  3  . NEXIUM 40 MG capsule TAKE 1 CAPSULE DAILY  90 capsule  2   No current facility-administered medications on file prior to visit.     No Known Allergies   Immunization History  Administered  Date(s) Administered  . Influenza Split 12/02/2012  . Tdap 10/30/2007  . Zoster 10/30/2007        Objective:   Physical Exam  Constitutional: He is oriented to person, place, and time. He appears well-nourished. No distress.  HENT:  Head: Normocephalic.  Eyes: No scleral icterus.  Neck: Neck supple. No thyromegaly present.  Cardiovascular: Normal rate, regular rhythm and normal heart sounds.  Exam reveals no gallop and no friction rub.   No murmur heard. Pulmonary/Chest: Breath sounds normal. No respiratory distress. He exhibits no tenderness.  Musculoskeletal: He exhibits no edema.  Neurological: He is alert and oriented to person, place, and time.  Skin: Skin is warm and dry. No rash noted.  Psychiatric: He has a normal mood and affect. His behavior is normal. Judgment and thought content normal.      Assessment & Plan:    Johnathan Long was seen today for medication management.  Diagnoses and associated orders for this visit:  Gout - allopurinol (ZYLOPRIM) 100 MG tablet; Take 1 tablet (100 mg total) by mouth  daily.  Osteoarthritis - nabumetone (RELAFEN) 750 MG tablet; Take 1 tablet (750 mg total) by mouth 2 (two) times daily.  Essential hypertension, benign - carvedilol (COREG) 25 MG tablet; Take 1 tablet (25 mg total) by mouth 2 (two) times daily with a meal.  BPH (benign prostatic hyperplasia) - tadalafil (CIALIS) 5 MG tablet; Take 1 tablet (5 mg total) by mouth daily as needed.  Type II or unspecified type diabetes mellitus without mention of complication, not stated as uncontrolled - Saxagliptin-Metformin (KOMBIGLYZE XR) 2.07-998 MG TB24; Take 2 tablets by mouth daily. - POCT glycosylated hemoglobin (Hb A1C)  Restless legs syndrome (RLS) - rOPINIRole (REQUIP) 1 MG tablet; Take 1 tablet (1 mg total) by mouth at bedtime.  Pain in joint, shoulder region - diclofenac sodium (VOLTAREN) 1 % GEL; Apply 4 g topically 4 (four) times daily.   TIME SPENT "FACE TO FACE" WITH PATIENT -  45 MINS

## 2013-08-05 ENCOUNTER — Other Ambulatory Visit: Payer: Self-pay | Admitting: Family Medicine

## 2013-08-05 ENCOUNTER — Other Ambulatory Visit: Payer: Self-pay | Admitting: *Deleted

## 2013-08-05 DIAGNOSIS — E785 Hyperlipidemia, unspecified: Secondary | ICD-10-CM

## 2013-08-05 DIAGNOSIS — E119 Type 2 diabetes mellitus without complications: Secondary | ICD-10-CM

## 2013-08-05 DIAGNOSIS — I10 Essential (primary) hypertension: Secondary | ICD-10-CM

## 2013-08-06 ENCOUNTER — Other Ambulatory Visit: Payer: Medicare Other

## 2013-08-06 LAB — COMPLETE METABOLIC PANEL WITH GFR
ALT: 30 U/L (ref 0–53)
AST: 25 U/L (ref 0–37)
Albumin: 4 g/dL (ref 3.5–5.2)
Alkaline Phosphatase: 54 U/L (ref 39–117)
BUN: 17 mg/dL (ref 6–23)
CO2: 25 mEq/L (ref 19–32)
Calcium: 9.2 mg/dL (ref 8.4–10.5)
Chloride: 105 mEq/L (ref 96–112)
Creat: 1.04 mg/dL (ref 0.50–1.35)
GFR, Est African American: 85 mL/min
GFR, Est Non African American: 73 mL/min
Glucose, Bld: 105 mg/dL — ABNORMAL HIGH (ref 70–99)
Potassium: 3.6 mEq/L (ref 3.5–5.3)
Sodium: 139 mEq/L (ref 135–145)
Total Bilirubin: 0.7 mg/dL (ref 0.2–1.2)
Total Protein: 7.1 g/dL (ref 6.0–8.3)

## 2013-08-06 LAB — LIPID PANEL
Cholesterol: 183 mg/dL (ref 0–200)
HDL: 36 mg/dL — ABNORMAL LOW (ref >39)
LDL Cholesterol: 118 mg/dL — ABNORMAL HIGH (ref 0–99)
Total CHOL/HDL Ratio: 5.1 Ratio
Triglycerides: 146 mg/dL (ref <150)
VLDL: 29 mg/dL (ref 0–40)

## 2013-08-06 LAB — HEMOGLOBIN A1C
Hgb A1c MFr Bld: 6.1 % — ABNORMAL HIGH (ref <5.7)
Mean Plasma Glucose: 128 mg/dL — ABNORMAL HIGH (ref <117)

## 2013-08-11 ENCOUNTER — Ambulatory Visit (INDEPENDENT_AMBULATORY_CARE_PROVIDER_SITE_OTHER): Payer: Medicare Other | Admitting: Family Medicine

## 2013-08-11 ENCOUNTER — Encounter: Payer: Self-pay | Admitting: Family Medicine

## 2013-08-11 VITALS — BP 152/71 | HR 71 | Resp 16 | Wt 250.0 lb

## 2013-08-11 DIAGNOSIS — I10 Essential (primary) hypertension: Secondary | ICD-10-CM

## 2013-08-11 DIAGNOSIS — R51 Headache: Secondary | ICD-10-CM

## 2013-08-11 DIAGNOSIS — E785 Hyperlipidemia, unspecified: Secondary | ICD-10-CM

## 2013-08-11 DIAGNOSIS — E119 Type 2 diabetes mellitus without complications: Secondary | ICD-10-CM

## 2013-08-11 LAB — URIC ACID: Uric Acid, Serum: 5.1 mg/dL (ref 4.0–7.8)

## 2013-08-11 MED ORDER — COLESEVELAM HCL 3.75 G PO PACK: 1.0000 | PACK | Freq: Every day | ORAL | Status: AC

## 2013-08-11 MED ORDER — CANAGLIFLOZIN 300 MG PO TABS
1.0000 | ORAL_TABLET | Freq: Every day | ORAL | Status: AC
Start: 2013-08-11 — End: 2014-08-11

## 2013-08-11 MED ORDER — EZETIMIBE 10 MG PO TABS: 10.0000 mg | ORAL_TABLET | Freq: Every day | ORAL | Status: AC

## 2013-08-11 MED ORDER — SAXAGLIPTIN-METFORMIN ER 2.5-1000 MG PO TB24: 2.0000 | ORAL_TABLET | Freq: Every day | ORAL | Status: AC

## 2013-08-11 MED ORDER — TOPIRAMATE 50 MG PO TABS
50.0000 mg | ORAL_TABLET | Freq: Two times a day (BID) | ORAL | Status: AC
Start: 2013-08-11 — End: 2014-08-12

## 2013-08-11 MED ORDER — AMLODIPINE-VALSARTAN-HCTZ 10-320-25 MG PO TABS: 1.0000 | ORAL_TABLET | Freq: Every day | ORAL | Status: DC

## 2013-08-11 NOTE — Patient Instructions (Signed)
1)  BP - Continue on the Exforge HCT and Coreg.  Take the Tribenzor in place of the Exforge until you get it in the mail.    2)  Blood  Sugar - Your A1c is good at 6.1% so continue taking the Invokana 300 mg in the am and one Kombiglyze at night.    3)  Cholesterol - Stay on the combination of Zetia and Welchol.    4)  Headaches/Weight/Soda - You might consider trying the medication Topamax.  The way you take it is 1/2 tab at bedtime for 1 week then you increase to 1 tablet at night for 1 week then 1/2 tab in the am and 1 at bedtime for 1 week and then 1 in am and 1 at night.  This medication helps decrease headaches, helps with weight loss and makes sodas taste bad.

## 2013-08-11 NOTE — Progress Notes (Signed)
Subjective:    Patient ID: Johnathan Long, male    DOB: 27-Oct-1945, 68 y.o.   MRN: [id number]  HPI  Johnathan Long is here today to go his recent lab results. We are also going over the following issues and refilling mediations:  1)  Hypertension:  He is doing well on the Coreg and Exforge HCT.  [facility] says that [facility] not been [facility] him his Exforge HCT.  2)  Hyperlipidemia:  He is doing well on the Zetia 10 mg and fish oil.   3)  GERD:  Controlled with the Nexium.  4)  Gout:  He takes allopurinol to prevent gout flare ups.  5)  Type II DM:  He is doing well on the [place] and Kombiglyze. He admits to drinking a lot of soft drinks lately. He has gain 10 pounds since his last visit.  6)  Restless Leg Syndrome:  He does well on the Requip.     Review of Systems  Constitutional: Negative for activity change, appetite change and fatigue.  Cardiovascular: Negative for chest pain, palpitations and leg swelling.  Endocrine: Negative for polydipsia, polyphagia and polyuria.  Neurological: Negative for headaches.  Psychiatric/Behavioral: Negative for behavioral problems and sleep disturbance. The patient is not nervous/anxious.   All other systems reviewed and are negative.   Past Medical History  Diagnosis Date  . Hyperlipidemia   . Diabetes mellitus without complication   . Hypertension   . Restless leg syndrome   . Gout   . PTSD (post-traumatic stress disorder)   . Glaucoma   . Erectile dysfunction   . Allergy      Past Surgical History  Procedure Laterality Date  . Cardiac catherization  2000  . Umbilical hernia repair  06/2011     History   Social History Narrative   Marital Status: Married Nature conservation officer(Johnathan Long)   Children:  Son (01) - Step Children (04)    Pets: Cat (01)    Living Situation: Lives with spouse   Occupation:  Retired    Programme researcher, broadcasting/film/videoducation:  Engineer, agriculturalHigh School Graduate    Tobacco Use/Exposure:  None    Alcohol Use:  None   Drug Use:  None   Diet:  Regular   Exercise:  Walking    Hobbies: Therapist, musicishing, Music, Reading                 Family History  Problem Relation Age of Onset  . Heart disease Father   . Cancer Sister     Breast Cancer   . Cancer Cousin     Several have had prostate cancer      Current Outpatient Prescriptions on File Prior to Visit  Medication Sig Dispense Refill  . allopurinol (ZYLOPRIM) 100 MG tablet Take 1 tablet (100 mg total) by mouth daily.  90 tablet  3  . aspirin 81 MG tablet Take 81 mg by mouth daily.      . carvedilol (COREG) 25 MG tablet Take 1 tablet (25 mg total) by mouth 2 (two) times daily with a meal.  180 tablet  3  . Cholecalciferol (VITAMIN D PO) Take 5,000 Units by mouth daily.      Marland Kitchen. NEXIUM 40 MG capsule TAKE 1 CAPSULE DAILY  90 capsule  2  . rOPINIRole (REQUIP) 1 MG tablet Take 1 tablet (1 mg total) by mouth at bedtime.  90 tablet  3  . tadalafil (CIALIS) 5 MG tablet Take 1 tablet (5 mg total) by mouth daily as needed.  90 tablet  3   No current facility-administered medications on file prior to visit.     No Known Allergies   Immunization History  Administered Date(s) Administered  . Influenza Split 12/02/2012  . Tdap 10/30/2007  . Zoster 10/30/2007       Objective:   Physical Exam  Nursing note and vitals reviewed. Constitutional: He is oriented to person, place, and time. He appears well-developed and well-nourished. No distress.  HENT:  Head: Normocephalic.  Eyes: No scleral icterus.  Neck: Neck supple. No thyromegaly present.  Cardiovascular: Normal rate, regular rhythm and normal heart sounds.  Exam reveals no gallop and no friction rub.   No murmur heard. Pulmonary/Chest: Effort normal and breath sounds normal. No respiratory distress. He exhibits no tenderness.  Musculoskeletal: He exhibits no edema.  Neurological: He is alert and oriented to person, place, and time.  Skin: Skin is warm and dry. No rash noted.  Psychiatric: He has a normal mood and affect. His behavior  is normal. Judgment and thought content normal.      Assessment & Plan:    Johnathan Long was seen today for medication management.  Diagnoses and associated orders for this visit:  Essential hypertension, benign - Discontinue: Amlodipine-Valsartan-HCTZ (EXFORGE HCT) 10-320-25 MG TABS; Take 1 tablet by mouth daily.  Type II or unspecified type diabetes mellitus without mention of complication, not stated as uncontrolled - Saxagliptin-Metformin (KOMBIGLYZE XR) 2.07-998 MG TB24; Take 2 tablets by mouth daily. - Canagliflozin (INVOKANA) 300 MG TABS; Take 1 tablet (300 mg total) by mouth daily before breakfast.  Other and unspecified hyperlipidemia - ezetimibe (ZETIA) 10 MG tablet; Take 1 tablet (10 mg total) by mouth daily. - Colesevelam HCl 3.75 G PACK; Take 1 packet by mouth daily.  Headache(784.0) - topiramate (TOPAMAX) 50 MG tablet; Take 1 tablet (50 mg total) by mouth 2 (two) times daily.   TIME SPENT "FACE TO FACE" WITH PATIENT -  30 MINS

## 2013-08-14 ENCOUNTER — Telehealth: Payer: Self-pay

## 2013-08-14 ENCOUNTER — Other Ambulatory Visit: Payer: Self-pay | Admitting: *Deleted

## 2013-08-14 DIAGNOSIS — I10 Essential (primary) hypertension: Secondary | ICD-10-CM

## 2013-08-14 MED ORDER — AMLODIPINE-VALSARTAN-HCTZ 10-320-25 MG PO TABS: 1.0000 | ORAL_TABLET | Freq: Every day | ORAL | Status: AC

## 2013-08-14 NOTE — Telephone Encounter (Addendum)
Mr Watring called to see if you could call in a prescription for Exforge HCT at least a 15 day supply, because [facility] want fill it until they receive their payment from him for previous medicines. His cell phone is [phone] Mr. Mcferran would like for RX to be called into [facility] on [address] in [place] [facility] [phone]. He asked how much would be the difference for 15 days or 30 days.

## 2013-11-11 ENCOUNTER — Other Ambulatory Visit: Payer: Self-pay | Admitting: Family Medicine

## 2013-11-14 ENCOUNTER — Other Ambulatory Visit: Payer: Self-pay | Admitting: Family Medicine

## 2018-08-02 MED ORDER — GENERIC EXTERNAL MEDICATION
1.00 | Status: DC
Start: 2018-08-03 — End: 2018-08-02

## 2018-08-02 MED ORDER — GUAIFENESIN-DM 100-10 MG/5ML PO SYRP
5.00 | ORAL_SOLUTION | ORAL | Status: DC
Start: ? — End: 2018-08-02

## 2018-08-02 MED ORDER — ASPIRIN 325 MG PO TABS
325.00 | ORAL_TABLET | ORAL | Status: DC
Start: 2018-08-03 — End: 2018-08-02

## 2018-08-02 MED ORDER — VITAMIN B-12 1000 MCG PO TABS
1000.00 | ORAL_TABLET | ORAL | Status: DC
Start: 2018-08-03 — End: 2018-08-02

## 2018-08-02 MED ORDER — FLUTICASONE PROPIONATE 50 MCG/ACT NA SUSP
1.00 | NASAL | Status: DC
Start: 2018-08-03 — End: 2018-08-02

## 2018-08-02 MED ORDER — ALLOPURINOL 100 MG PO TABS
100.00 | ORAL_TABLET | ORAL | Status: DC
Start: 2018-08-03 — End: 2018-08-02

## 2018-08-02 MED ORDER — HYDROCHLOROTHIAZIDE 25 MG PO TABS
25.00 | ORAL_TABLET | ORAL | Status: DC
Start: 2018-08-03 — End: 2018-08-02

## 2018-08-02 MED ORDER — GLUCOSE 40 % PO GEL
15.00 | ORAL | Status: DC
Start: ? — End: 2018-08-02

## 2018-08-02 MED ORDER — ENOXAPARIN SODIUM 40 MG/0.4ML ~~LOC~~ SOLN
40.00 | SUBCUTANEOUS | Status: DC
Start: 2018-08-02 — End: 2018-08-02

## 2018-08-02 MED ORDER — ONDANSETRON HCL 4 MG/2ML IJ SOLN
4.00 | INTRAMUSCULAR | Status: DC
Start: ? — End: 2018-08-02

## 2018-08-02 MED ORDER — ALBUTEROL SULFATE HFA 108 (90 BASE) MCG/ACT IN AERS
2.00 | INHALATION_SPRAY | RESPIRATORY_TRACT | Status: DC
Start: ? — End: 2018-08-02

## 2018-08-02 MED ORDER — GENERIC EXTERNAL MEDICATION
75.00 | Status: DC
Start: 2018-08-03 — End: 2018-08-02

## 2018-08-02 MED ORDER — DOCUSATE SODIUM 100 MG PO CAPS
100.00 | ORAL_CAPSULE | ORAL | Status: DC
Start: ? — End: 2018-08-02

## 2018-08-02 MED ORDER — BISACODYL 5 MG PO TBEC
10.00 | DELAYED_RELEASE_TABLET | ORAL | Status: DC
Start: ? — End: 2018-08-02

## 2018-08-02 MED ORDER — LABETALOL HCL 200 MG PO TABS
200.00 | ORAL_TABLET | ORAL | Status: DC
Start: 2018-08-02 — End: 2018-08-02

## 2018-08-02 MED ORDER — AMLODIPINE BESYLATE 5 MG PO TABS
10.00 | ORAL_TABLET | ORAL | Status: DC
Start: 2018-08-03 — End: 2018-08-02

## 2018-08-02 MED ORDER — DEXTROSE 10 % IV SOLN
125.00 | INTRAVENOUS | Status: DC
Start: ? — End: 2018-08-02

## 2018-08-02 MED ORDER — GLUCAGON HCL RDNA (DIAGNOSTIC) 1 MG IJ SOLR
1.00 | INTRAMUSCULAR | Status: DC
Start: ? — End: 2018-08-02

## 2018-08-02 MED ORDER — CHOLECALCIFEROL 25 MCG (1000 UT) PO TABS
1000.00 | ORAL_TABLET | ORAL | Status: DC
Start: 2018-08-03 — End: 2018-08-02

## 2018-08-02 MED ORDER — ACETAMINOPHEN 325 MG PO TABS
650.00 | ORAL_TABLET | ORAL | Status: DC
Start: ? — End: 2018-08-02

## 2018-08-02 MED ORDER — HYDRALAZINE HCL 50 MG PO TABS
100.00 | ORAL_TABLET | ORAL | Status: DC
Start: 2018-08-02 — End: 2018-08-02

## 2018-08-02 MED ORDER — INSULIN LISPRO 100 UNIT/ML ~~LOC~~ SOLN
2.00 | SUBCUTANEOUS | Status: DC
Start: 2018-08-02 — End: 2018-08-02

## 2018-08-02 MED ORDER — EZETIMIBE 10 MG PO TABS
10.00 | ORAL_TABLET | ORAL | Status: DC
Start: 2018-08-03 — End: 2018-08-02

## 2021-05-08 ENCOUNTER — Other Ambulatory Visit: Payer: Self-pay

## 2021-05-08 ENCOUNTER — Encounter (HOSPITAL_BASED_OUTPATIENT_CLINIC_OR_DEPARTMENT_OTHER): Payer: Self-pay | Admitting: Emergency Medicine

## 2021-05-08 ENCOUNTER — Emergency Department (HOSPITAL_BASED_OUTPATIENT_CLINIC_OR_DEPARTMENT_OTHER)
Admission: EM | Admit: 2021-05-08 | Discharge: 2021-05-08 | Disposition: A | Discharge status: Home / Self Care | Payer: Medicare (Managed Care) | Attending: Emergency Medicine | Admitting: Emergency Medicine

## 2021-05-08 DIAGNOSIS — I10 Essential (primary) hypertension: Secondary | ICD-10-CM | POA: Insufficient documentation

## 2021-05-08 DIAGNOSIS — Z79899 Other long term (current) drug therapy: Secondary | ICD-10-CM | POA: Insufficient documentation

## 2021-05-08 DIAGNOSIS — Z7982 Long term (current) use of aspirin: Secondary | ICD-10-CM | POA: Insufficient documentation

## 2021-05-08 DIAGNOSIS — R42 Dizziness and giddiness: Secondary | ICD-10-CM | POA: Insufficient documentation

## 2021-05-08 DIAGNOSIS — E119 Type 2 diabetes mellitus without complications: Secondary | ICD-10-CM | POA: Diagnosis not present

## 2021-05-08 LAB — CBC WITH DIFFERENTIAL/PLATELET
Abs Immature Granulocytes: 0.02 10*3/uL (ref 0.00–0.07)
Basophils Absolute: 0 10*3/uL (ref 0.0–0.1)
Basophils Relative: 1 %
Eosinophils Absolute: 0.2 10*3/uL (ref 0.0–0.5)
Eosinophils Relative: 2 %
HCT: 40.3 % (ref 39.0–52.0)
Hemoglobin: 13.3 g/dL (ref 13.0–17.0)
Immature Granulocytes: 0 %
Lymphocytes Relative: 18 %
Lymphs Abs: 1.2 10*3/uL (ref 0.7–4.0)
MCH: 30.2 pg (ref 26.0–34.0)
MCHC: 33 g/dL (ref 30.0–36.0)
MCV: 91.6 fL (ref 80.0–100.0)
Monocytes Absolute: 0.4 10*3/uL (ref 0.1–1.0)
Monocytes Relative: 6 %
Neutro Abs: 4.8 10*3/uL (ref 1.7–7.7)
Neutrophils Relative %: 73 %
Platelets: 271 10*3/uL (ref 150–400)
RBC: 4.4 MIL/uL (ref 4.22–5.81)
RDW: 14 % (ref 11.5–15.5)
WBC: 6.6 10*3/uL (ref 4.0–10.5)
nRBC: 0 % (ref 0.0–0.2)

## 2021-05-08 LAB — COMPREHENSIVE METABOLIC PANEL
ALT: 49 U/L — ABNORMAL HIGH (ref 0–44)
AST: 36 U/L (ref 15–41)
Albumin: 3.5 g/dL (ref 3.5–5.0)
Alkaline Phosphatase: 49 U/L (ref 38–126)
Anion gap: 10 (ref 5–15)
BUN: 21 mg/dL (ref 8–23)
CO2: 23 mmol/L (ref 22–32)
Calcium: 8.6 mg/dL — ABNORMAL LOW (ref 8.9–10.3)
Chloride: 105 mmol/L (ref 98–111)
Creatinine, Ser: 1.21 mg/dL (ref 0.61–1.24)
GFR, Estimated: >60 mL/min (ref >60)
Glucose, Bld: 223 mg/dL — ABNORMAL HIGH (ref 70–99)
Potassium: 3.5 mmol/L (ref 3.5–5.1)
Sodium: 138 mmol/L (ref 135–145)
Total Bilirubin: 0.8 mg/dL (ref 0.3–1.2)
Total Protein: 7.1 g/dL (ref 6.5–8.1)

## 2021-05-08 LAB — CBG MONITORING, ED: Glucose-Capillary: 163 mg/dL — ABNORMAL HIGH (ref 70–99)

## 2021-05-08 MED ORDER — LACTATED RINGERS IV BOLUS
1000.0000 mL | Freq: Once | INTRAVENOUS | Status: AC
  Administered 2021-05-08: 1000 mL via INTRAVENOUS

## 2021-05-08 NOTE — ED Notes (Signed)
ED Provider at bedside. 

## 2021-05-08 NOTE — Discharge Instructions (Addendum)
If you develop new or worsening lightheadedness/dizziness, or if you develop a headache, chest pain, palpitations, nausea or vomiting, vision complaints, or any other new/concerning symptoms then return to the ER for evaluation or call 911. ? ?Otherwise, follow-up with your primary care physician to see if there is further work-up for your lightheadedness that you have been having. ?

## 2021-05-08 NOTE — ED Provider Notes (Signed)
?[facility] EMERGENCY DEPARTMENT ?Provider Note ? ? ?CSN: [id number] ?Arrival date & time: 05/08/21  0906 ? ?  ? ?History ? ?Chief Complaint  ?Patient presents with  ? Dizziness  ? ? ?Johnathan Long is a 76 y.o. male. ? ?HPI ?76 year old male presents with lightheadedness.  Started about 30 minutes prior to arrival.  He states he was getting ready to take a bath and he felt "fuzzy headed".  He walked back to his bed to lay down and did feel little bit better laying down.  However he was still symptomatic so he called 911.  Transiently had a little bit of watery discharge out of his right eye but otherwise no vision complaints or headache.  He states his head still feels funny but there is no room spinning sensation, nausea or vomiting, or weakness/numbness in the extremities.  No speech difficulty or chest pain.  He states he has had similar episodes but they do not typically last as long on and off for a while.  No recent medication changes or illness.  He has been ambulatory since this started and has no ataxia or difficulty walking per him.  He walked in to the ER when EMS brought him in without difficulty witnessed by staff. ? ?Has multiple comorbidities including prior stroke, hypertension, diabetes. ? ?Home Medications ?Prior to Admission medications   ?Medication Sig Start Date End Date Taking? Authorizing Provider  ?allopurinol (ZYLOPRIM) 100 MG tablet Take 1 tablet (100 mg total) by mouth daily. 04/09/13 04/09/14  Gillian Scarce, MD  ?Amlodipine-Valsartan-HCTZ (EXFORGE HCT) 88-280-03 MG TABS Take 1 tablet by mouth daily. 08/14/13 08/14/14  Gillian Scarce, MD  ?aspirin 81 MG tablet Take 81 mg by mouth daily.    [provider]  ?carvedilol (COREG) 25 MG tablet Take 1 tablet (25 mg total) by mouth 2 (two) times daily with a meal. 04/09/13 04/09/14  Zanard, Hinton Dyer, MD  ?Cholecalciferol (VITAMIN D PO) Take 5,000 Units by mouth daily.    [provider]  ?ezetimibe (ZETIA) 10 MG tablet Take  1 tablet (10 mg total) by mouth daily. 08/11/13 08/11/14  Gillian Scarce, MD  ?NEXIUM 40 MG capsule TAKE 1 CAPSULE DAILY 03/07/13   Zanard, Hinton Dyer, MD  ?Omega-3 Fatty Acids (FISH OIL) 1000 MG CAPS Take 1,000 mg by mouth daily.    [provider]  ?rOPINIRole (REQUIP) 1 MG tablet Take 1 tablet (1 mg total) by mouth at bedtime. 04/09/13 04/09/14  Gillian Scarce, MD  ?Saxagliptin-Metformin (KOMBIGLYZE XR) 2.07-998 MG TB24 Take 2 tablets by mouth daily. 08/11/13 08/11/14  Gillian Scarce, MD  ?tadalafil (CIALIS) 5 MG tablet Take 1 tablet (5 mg total) by mouth daily as needed. 04/09/13 04/09/14  Gillian Scarce, MD  ?   ? ?Allergies    ?Lisinopril and Penicillins   ? ?Review of Systems   ?Review of Systems  ?Constitutional:  Negative for fever.  ?Eyes:  Negative for pain and visual disturbance.  ?Respiratory:  Negative for shortness of breath.   ?Cardiovascular:  Negative for chest pain.  ?Gastrointestinal:  Negative for nausea and vomiting.  ?Musculoskeletal:  Negative for gait problem.  ?Neurological:  Positive for light-headedness. Negative for dizziness, speech difficulty, weakness, numbness and headaches.  ? ?Physical Exam ?Updated Vital Signs ?BP (!) 131/56 (BP Location: Right Arm)   Pulse 71   Temp 98.1 ?F (36.7 ?C) (Oral)   Resp 13   Ht 5\' 9"  (1.753 m)   Wt 110.2  kg   SpO2 91%   BMI 35.87 kg/m?  ?Physical Exam ?Vitals and nursing note reviewed.  ?Constitutional:   ?   General: He is not in acute distress. ?   Appearance: He is well-developed. He is not ill-appearing or diaphoretic.  ?HENT:  ?   Head: Normocephalic and atraumatic.  ?Eyes:  ?   Extraocular Movements: Extraocular movements intact.  ?   Pupils: Pupils are equal, round, and reactive to light.  ?   Comments: Normal visual field testing  ?Cardiovascular:  ?   Rate and Rhythm: Normal rate and regular rhythm.  ?   Heart sounds: Murmur heard.  ?Pulmonary:  ?   Effort: Pulmonary effort is normal.  ?   Breath sounds: Normal breath sounds.  ?Abdominal:  ?    Palpations: Abdomen is soft.  ?   Tenderness: There is no abdominal tenderness.  ?Skin: ?   General: Skin is warm and dry.  ?Neurological:  ?   Mental Status: He is alert.  ?   Comments: CN 3-12 grossly intact. 5/5 strength in all 4 extremities. Grossly normal sensation. Normal finger to nose.   ? ? ?ED Results / Procedures / Treatments   ?Labs ?(all labs ordered are listed, but only abnormal results are displayed) ?Labs Reviewed  ?COMPREHENSIVE METABOLIC PANEL - Abnormal; Notable for the following components:  ?    Result Value  ? Glucose, Bld 223 (*)   ? Calcium 8.6 (*)   ? ALT 49 (*)   ? All other components within normal limits  ?CBG MONITORING, ED - Abnormal; Notable for the following components:  ? Glucose-Capillary 163 (*)   ? All other components within normal limits  ?CBC WITH DIFFERENTIAL/PLATELET  ? ? ?EKG ?EKG Interpretation ? ?Date/Time:  Sunday May 08 2021 09:11:40 EST ?Ventricular Rate:  75 ?PR Interval:  229 ?QRS Duration: 93 ?QT Interval:  397 ?QTC Calculation: 441 ?R Axis:   32 ?Text Interpretation: Sinus rhythm Atrial premature complex Prolonged PR interval Probable left atrial enlargement Abnrm T, consider ischemia, anterolateral lds No old tracing to compare Confirmed by Pricilla Loveless [phone]) on 05/08/2021 9:18:17 AM ? ?Radiology ?No results found. ? ?Procedures ?Procedures  ? ? ?Medications Ordered in ED ?Medications  ?lactated ringers bolus 1,000 mL (1,000 mLs Intravenous New Bag/Given 05/08/21 0943)  ? ? ?ED Course/ Medical Decision Making/ A&P ?  ?                        ? ?Patient appears to have a chief complaint of lightheadedness but no other alarming symptoms.  He noted some clear watery drainage from his eye but no visual complaints or eye pain.  His exam is unremarkable.  He is ambulatory without difficulty.  Given this, while he has significant comorbidities, I think stroke is pretty unlikely and do not think acute CNS imaging is needed.  He has nonspecific T waves and there is no  old EKG to compare to when I look, but he has had nonspecific T waves noted on ECGs from care everywhere in the past.  I also reviewed his most recent echocardiogram which showed some aortic stenosis, this likely correlates to the murmur I am hearing.  However he does not have CHF.  No arrhythmias on cardiac monitoring.  At this point, he is feeling better after a bolus of IV fluids.  His labs were reviewed/interpreted and shows some mild hyperglycemia but no other significant findings.  I do not  think he has an emergent condition and given he feels better I think is reasonable to have him follow-up with his PCP and we will discharge with return precautions. ? ? ? ? ? ? ? ?Final Clinical Impression(s) / ED Diagnoses ?Final diagnoses:  ?Lightheadedness  ? ? ?Rx / DC Orders ?ED Discharge Orders   ? ? None  ? ?  ? ? ?  ?Pricilla Loveless, MD ?05/08/21 1102 ? ?

## 2021-05-08 NOTE — ED Triage Notes (Addendum)
Pt arrives [facility] from home, 30 mins prior to EMS call, felt "woozy, dizzy when moving". Ambulatory to room on arrival to ED. Stroke screen neg. Per EMT. ?Per pt, states getting ready for church, endorses "dizziness when going to bathroom, then layed down on bed to see if would resolve". Denies HA, denies n/v, bilaterally equal, AOx4 ?

## 2021-05-08 NOTE — ED Notes (Addendum)
Lying flat on stretcher for 10 min for Orthostatic VS ?
